# Patient Record
Sex: Female | Born: 1980 | Race: White | Hispanic: No | Marital: Single | Smoking: Never smoker
Health system: Southern US, Community
[De-identification: ages and names within clinical notes are randomized; demographics above are authoritative.]

## PROBLEM LIST (undated history)

## (undated) DIAGNOSIS — L309 Dermatitis, unspecified: Secondary | ICD-10-CM

## (undated) DIAGNOSIS — K529 Noninfective gastroenteritis and colitis, unspecified: Secondary | ICD-10-CM

## (undated) HISTORY — DX: Noninfective gastroenteritis and colitis, unspecified: K52.9

## (undated) HISTORY — DX: Dermatitis, unspecified: L30.9

## (undated) HISTORY — PX: HIP SURGERY: SHX245

---

## 2017-07-01 ENCOUNTER — Ambulatory Visit (INDEPENDENT_AMBULATORY_CARE_PROVIDER_SITE_OTHER): Payer: BC Managed Care – PPO

## 2017-07-01 ENCOUNTER — Ambulatory Visit (INDEPENDENT_AMBULATORY_CARE_PROVIDER_SITE_OTHER): Payer: BC Managed Care – PPO | Admitting: Orthopaedic Surgery

## 2017-07-01 ENCOUNTER — Encounter (INDEPENDENT_AMBULATORY_CARE_PROVIDER_SITE_OTHER): Payer: Self-pay | Admitting: Orthopaedic Surgery

## 2017-07-01 DIAGNOSIS — M25551 Pain in right hip: Secondary | ICD-10-CM

## 2017-07-01 MED ORDER — NABUMETONE 500 MG PO TABS: 500.0000 mg | ORAL_TABLET | Freq: Two times a day (BID) | ORAL | 1 refills | Status: DC | PRN

## 2017-07-01 MED ORDER — METHYLPREDNISOLONE 4 MG PO TABS: ORAL_TABLET | ORAL | 0 refills | Status: DC

## 2017-07-01 NOTE — Progress Notes (Signed)
Office Visit Note   Patient: Natasha Matthews           Date of Birth: Oct 24, 1980           MRN: [id number] Visit Date: 07/01/2017              Requested by: Farris Has, MD [address] [place], [place] [address] PCP: Farris Has, MD   Assessment & Plan: Visit Diagnoses:  1. Pain in right hip     Plan: I feel mostly that she is strained her gluteus medius and minimus tendon areas potentially some of her hamstring as well.  I will put her on a 6-day steroid taper and Relafen as an anti-inflammatory and send her physical therapy as well.  All questions concerns were answered and addressed.  If she continues to have significant problems after activity modification as well as the anti-inflammatories and therapy we would obtain an MRI of her right hip but hopefully we do not need to do that.  Follow-Up Instructions: Return in about 1 month (around 07/29/2017).   Orders:  Orders Placed This Encounter  Procedures  . XR HIP UNILAT W OR W/O PELVIS 1V RIGHT   Meds ordered this encounter  Medications  . methylPREDNISolone (MEDROL) 4 MG tablet    Sig: Medrol dose pack. Take as instructed    Dispense:  21 tablet    Refill:  0  . nabumetone (RELAFEN) 500 MG tablet    Sig: Take 1 tablet (500 mg total) by mouth 2 (two) times daily as needed.    Dispense:  60 tablet    Refill:  1      Procedures: No procedures performed   Clinical Data: No additional findings.   Subjective: Chief Complaint  Patient presents with  . Right Hip - Pain  The patient is a very athletic 37 year old female who comes in with a 5-week history of right hip pain.  She is playing volleyball in the splint and felt a pop and snap in her trochanteric area and hamstring area approximately.  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  She says that the driving is the worst right now.  Laying on that side is also painful to her.  She is  tried to back off on yoga and some of her other activities but still having a persistent pain.  She takes ibuprofen around-the-clock.  She is never injured this area before.  She denies any pain in the groin.  She denies any knee pain.  This is on the right side of the trochanteric area mostly where she points to.  HPI  Review of Systems She denies any recent illnesses or systemic issues.  Objective: Vital Signs: There were no vitals taken for this visit.  Physical Exam She is alert and oriented x3 and in no acute distress Ortho Exam Examination of her right hip shows diffuse tenderness around the proximal hamstring area and the trochanteric area and some around the IT band.  Range of motion is entirely full with extremes of internal or external rotation he feels pain over the trochanteric area. Specialty Comments:  No specialty comments available.  Imaging: Xr Hip Unilat W Or W/o Pelvis 1v Right  Result Date: 07/01/2017 An AP pelvis and lateral the right hip shows no acute findings.  The hip joint is well-maintained and the trochanteric area appears normal.    PMFS History: There  are no active problems to display for this patient.  History reviewed. No pertinent past medical history.  History reviewed. No pertinent family history.  History reviewed. No pertinent surgical history. Social History   Occupational History  . Not on file  Tobacco Use  . Smoking status: Not on file  Substance and Sexual Activity  . Alcohol use: Not on file  . Drug use: Not on file  . Sexual activity: Not on file

## 2017-07-24 ENCOUNTER — Encounter (INDEPENDENT_AMBULATORY_CARE_PROVIDER_SITE_OTHER): Payer: Self-pay | Admitting: Orthopaedic Surgery

## 2017-07-24 ENCOUNTER — Ambulatory Visit (INDEPENDENT_AMBULATORY_CARE_PROVIDER_SITE_OTHER): Payer: BC Managed Care – PPO | Admitting: Orthopaedic Surgery

## 2017-07-24 DIAGNOSIS — M25551 Pain in right hip: Secondary | ICD-10-CM | POA: Diagnosis not present

## 2017-07-24 NOTE — Progress Notes (Signed)
The patient is well-known to Natasha Matthews.  She is 37 year old very active and athletic female who injured her right hip during athletic activities on April 7 of this year.  It is now 8 weeks since this original injury.  She felt and heard pops around her right hip at the time.  She points the trochanteric area and issue was a source of her pain.  She is actually been through activity modification and rest.  She is been on anti-inflammatories.  I have been tried a steroid taper.  She has had formal physical therapy as well with even dry needling.  She has modified all of her activities and rested significantly.  She is still experiencing the significant amount of pain all around her right hip in spite of all the aforementioned activities.  On exam she still has significant pain to palpation all around the gluteus medius and minimus insertion at the greater trochanteric area on the right side.  She hurts significantly on the issue him as well.  There is no pain on the abductor aspect of the hip and no pain in the groin.  Her x-rays have been negative thus far.  At this point given the failure of all forms of conservative treatment measures, and MRI is warranted of the right hip to rule out a tear of the gluteus medius and minimus tendons and to assess the hamstring origin as well given her clinical signs and symptoms.  We will see her back once the MRI is obtained.

## 2017-07-25 ENCOUNTER — Other Ambulatory Visit (INDEPENDENT_AMBULATORY_CARE_PROVIDER_SITE_OTHER): Payer: Self-pay

## 2017-07-25 DIAGNOSIS — M25551 Pain in right hip: Secondary | ICD-10-CM

## 2017-07-28 ENCOUNTER — Telehealth (INDEPENDENT_AMBULATORY_CARE_PROVIDER_SITE_OTHER): Payer: Self-pay | Admitting: *Deleted

## 2017-07-28 NOTE — Telephone Encounter (Signed)
Pt called left message on vm stating she was in to see Dr. Magnus IvanBlackman on Thursday and he was going to order MRI and has not heard from anyone to schedule the MRI, I called pt back left vm stating I received the order on 07/25/17 and had to go thru her insurance to get MRI approved which has been approved and then it was sent to [facility] to schedule. I advised pt this will take a couple days for them to get her scheduled but if she preferred she can call imaging to schedule appt to expedite it some. Pending pt call back to schedule appt with imaging.

## 2017-08-02 ENCOUNTER — Ambulatory Visit
Admission: RE | Admit: 2017-08-02 | Discharge: 2017-08-02 | Disposition: A | Discharge status: Home / Self Care | Payer: BC Managed Care – PPO | Source: Ambulatory Visit | Attending: Orthopaedic Surgery | Admitting: Orthopaedic Surgery

## 2017-08-02 DIAGNOSIS — M25551 Pain in right hip: Secondary | ICD-10-CM

## 2017-08-04 ENCOUNTER — Ambulatory Visit (INDEPENDENT_AMBULATORY_CARE_PROVIDER_SITE_OTHER): Payer: BC Managed Care – PPO | Admitting: Physician Assistant

## 2017-08-04 ENCOUNTER — Encounter (INDEPENDENT_AMBULATORY_CARE_PROVIDER_SITE_OTHER): Payer: Self-pay | Admitting: Physician Assistant

## 2017-08-04 DIAGNOSIS — M25551 Pain in right hip: Secondary | ICD-10-CM

## 2017-08-04 MED ORDER — LIDOCAINE HCL 1 % IJ SOLN
3.0000 mL | INTRAMUSCULAR | Status: AC | PRN
  Administered 2017-08-04: 3 mL

## 2017-08-04 MED ORDER — METHYLPREDNISOLONE ACETATE 40 MG/ML IJ SUSP
40.0000 mg | INTRAMUSCULAR | Status: AC | PRN
  Administered 2017-08-04: 40 mg via INTRA_ARTICULAR

## 2017-08-04 NOTE — Progress Notes (Signed)
Office Visit Note   Patient: Natasha Matthews           Date of Birth: 06-01-1980           MRN: [id number] Visit Date: 08/04/2017              Requested by: Farris HasMorrow, Aaron, MD [address] [place], [place] [id number] PCP: Farris HasMorrow, Aaron, MD   Assessment & Plan: Visit Diagnoses:  1. Pain in right hip     Plan: We will see how she denies in regards to the right lateral hip injection.  She is trending towards improvement over the next 2 weeks then will obtain an MRI of her right hip with contrast to rule out labral tear.  Questions were encouraged and answered at length.  Follow-Up Instructions: Return in about 2 weeks (around 08/18/2017).   Orders:  Orders Placed This Encounter  Procedures  . Large Joint Inj   No orders of the defined types were placed in this encounter.     Procedures: Large Joint Inj: R greater trochanter on 08/04/2017 12:10 PM Indications: pain Details: 22 G 1.5 in needle, lateral approach  Arthrogram: No  Medications: 3 mL lidocaine 1 %; 40 mg methylPREDNISolone acetate 40 MG/ML Outcome: tolerated well, no immediate complications Procedure, treatment alternatives, risks and benefits explained, specific risks discussed. Consent was given by the patient. Immediately prior to procedure a time out was called to verify the correct patient, procedure, equipment, support staff and site/side marked as required. Patient was prepped and draped in the usual sterile fashion.       Clinical Data: No additional findings.   Subjective: Chief Complaint  Patient presents with  . Right Hip - Pain, Follow-up    HPI  Natasha Matthews returns today for follow-up of her right hip pain.  She underwent MRI of her right hip due to severe pain she is having the lateral aspect of the hip and also some pain in her issue region whenever she is sitting.  She states the pain lateral aspect of the hip is worse.  It is worse whenever she is lying on her left side.  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  She had no radicular symptoms down the leg.  Pain began in April when she did a split felt popping in the hip.  She has been taking naproxen which is upsetting her stomach some and is really not helping much.  She is tried a Medrol Dosepak without any real relief.  She is also tried formal physical therapy including dry needling this gave her no significant relief and in fact the stretching exercises irritated her lateral posterior hip pain. MRI without contrast dated 08/02/2017 no acute fractures avulsion.  Edematous changes are seen consistent with mild right hamstring and abductor strain at the right initial tuberosity.  Images were reviewed with the patient today. Review of Systems   Objective: Vital Signs: There were no vitals taken for this visit.  Physical Exam  Ortho Exam Left hip full range of motion without pain.  She has slightly limited flexion of the right hip compared to left.  Right hip internal and external rotation causes pain posterior lateral aspect of the hip.  She has maximal tenderness just posterior to the greater trochanteric region of the right hip.  There is no rashes skin lesions ulcerations in this region.  Logroll of the hip causes pain especially with internal  rotation. Specialty Comments:  No specialty comments available.  Imaging: No results found.   PMFS History: There are no active problems to display for this patient.  History reviewed. No pertinent past medical history.  History reviewed. No pertinent family history.  History reviewed. No pertinent surgical history. Social History   Occupational History  . Not on file  Tobacco Use  . Smoking status: Never Smoker  . Smokeless tobacco: Never Used  Substance and Sexual Activity  . Alcohol use: Not on file  . Drug use: Not on file  . Sexual activity: Not on file

## 2018-01-02 ENCOUNTER — Other Ambulatory Visit: Payer: Self-pay | Admitting: Family Medicine

## 2018-01-02 ENCOUNTER — Other Ambulatory Visit (HOSPITAL_COMMUNITY)
Admission: RE | Admit: 2018-01-02 | Discharge: 2018-01-02 | Disposition: A | Discharge status: Home / Self Care | Payer: BC Managed Care – PPO | Source: Ambulatory Visit | Attending: Family Medicine | Admitting: Family Medicine

## 2018-01-02 DIAGNOSIS — Z01411 Encounter for gynecological examination (general) (routine) with abnormal findings: Secondary | ICD-10-CM | POA: Insufficient documentation

## 2018-01-06 LAB — CYTOLOGY - PAP
DIAGNOSIS: NEGATIVE
HPV 16/18/45 genotyping: NEGATIVE
HPV: DETECTED — AB

## 2018-01-21 ENCOUNTER — Other Ambulatory Visit: Payer: Self-pay | Admitting: Family Medicine

## 2018-01-21 DIAGNOSIS — R1084 Generalized abdominal pain: Secondary | ICD-10-CM

## 2018-01-28 ENCOUNTER — Ambulatory Visit
Admission: RE | Admit: 2018-01-28 | Discharge: 2018-01-28 | Disposition: A | Discharge status: Home / Self Care | Payer: BC Managed Care – PPO | Source: Ambulatory Visit | Attending: Family Medicine | Admitting: Family Medicine

## 2018-01-28 DIAGNOSIS — R1084 Generalized abdominal pain: Secondary | ICD-10-CM

## 2018-09-18 DIAGNOSIS — M25551 Pain in right hip: Secondary | ICD-10-CM | POA: Insufficient documentation

## 2019-01-01 DIAGNOSIS — R0982 Postnasal drip: Secondary | ICD-10-CM | POA: Insufficient documentation

## 2019-01-28 ENCOUNTER — Other Ambulatory Visit: Payer: Self-pay | Admitting: Family Medicine

## 2019-01-28 DIAGNOSIS — R160 Hepatomegaly, not elsewhere classified: Secondary | ICD-10-CM

## 2019-02-10 ENCOUNTER — Ambulatory Visit
Admission: RE | Admit: 2019-02-10 | Discharge: 2019-02-10 | Disposition: A | Discharge status: Home / Self Care | Payer: BC Managed Care – PPO | Source: Ambulatory Visit | Attending: Family Medicine | Admitting: Family Medicine

## 2019-02-10 DIAGNOSIS — R160 Hepatomegaly, not elsewhere classified: Secondary | ICD-10-CM

## 2019-05-04 ENCOUNTER — Encounter: Payer: Self-pay | Admitting: Family Medicine

## 2019-05-04 ENCOUNTER — Ambulatory Visit: Payer: Self-pay

## 2019-05-04 ENCOUNTER — Ambulatory Visit: Payer: BC Managed Care – PPO | Admitting: Family Medicine

## 2019-05-04 ENCOUNTER — Other Ambulatory Visit: Payer: Self-pay

## 2019-05-04 DIAGNOSIS — G8929 Other chronic pain: Secondary | ICD-10-CM

## 2019-05-04 DIAGNOSIS — M25551 Pain in right hip: Secondary | ICD-10-CM

## 2019-05-04 MED ORDER — TRAMADOL HCL 50 MG PO TABS: 50.0000 mg | ORAL_TABLET | Freq: Four times a day (QID) | ORAL | 0 refills | Status: DC | PRN

## 2019-05-04 NOTE — Progress Notes (Signed)
Office Visit Note   Patient: Natasha Matthews           Date of Birth: [birth year]           MRN: [id number] Visit Date: 05/04/2019 Requested by: London Pepper, MD [address] [place],  [place] [address] PCP: London Pepper, MD  Subjective: Chief Complaint  Patient presents with  . Right Hip - Pain    Injured 05/2017---history of 2 surgeries, and it is not any better.  Her PT recommended Dr. Junius Roads, most recent surgery/imaging was 11/2018.  She has brought a lot of info with her today.    HPI: She is here at the request of Linton Rump for right hip pain.  She started having pain a couple years ago, mostly on the lateral aspect but some anterior pain as well.  She had a MRI arthrogram which was unrevealing.  She had a greater trochanter injection which did not help.  She then had another MRI arthrogram which appeared normal but her symptoms were such that she proceeded with arthroscopic intervention.  At the time of surgery she was diagnosed with synovitis, a labrum tear, a pincer deformity and a cam deformity.  These were all treated.  Unfortunately she did not have relief after surgery.  She was given an intra-articular cortisone injection which did not make much difference for her.  She continued to have symptoms so she had another arthroscopic surgery and was told she had another labrum tear and more synovitis.  She went to rheumatologist who drew labs which were negative for rheumatologic disease.  She is now working with Dr. Vilinda Blanks for physical therapy and is getting improvement with dry needling, but she continues to have pain on the lateral and anterior aspect.  She is scheduled for a third surgical consult in a couple months but wondered whether prolotherapy might benefit her.  Eyes any popping or clicking in the hip.               ROS:   All other systems were reviewed and are negative.  Objective: Vital Signs: There were no vitals taken for this visit.  Physical Exam:   General:  Alert and oriented, in no acute distress. Pulm:  Breathing unlabored. Psy:  Normal mood, congruent affect. Skin: No rash or erythema Right hip: She has.  Range of motion, hypermobile in both hips.  No audible or palpable popping or clicking with active range of motion.  There is tenderness on the lateral hip near the TFL muscle and the superior aspect of the greater trochanter.  She also has pain with passive flexion and internal rotation.  Imaging: Limited diagnostic ultrasound right hip: There is postoperative change at the femoral head consistent with shaving of the cam deformity.  Slight thickening of the joint capsule but no effusion, no hyperemia on power Doppler imaging.  No definite labral pathology.  Lateral hip musculature is slightly hypoechoic with hyperemia on power Doppler imaging in the region of recent dry needling.  No muscle tears, no tendon tears.   Assessment & Plan: 1.  Chronic right hip pain status post 2 separate arthroscopic procedures, etiology uncertain. -Elected to treat with dextrose prolotherapy injecting intra-articular as well as into the lateral hip musculature.  Follow-up in about 3 weeks for the second of possibly 3 injections.     Procedures: Right hip injection: After sterile prep with Betadine, injected 6 cc 1% lidocaine without epinephrine and 4 cc 50% dextrose into the hip  joint intra-articularly using ultrasound to guide needle placement.  Injectate was seen filling the joint capsule.  Then injected cc 1% lidocaine without epinephrine and 4 cc 50% dextrose into the T FL and lateral musculature by palpation.  She had modest improvement in pain during the anesthetic phase.    PMFS History: There are no problems to display for this patient.  History reviewed. No pertinent past medical history.  History reviewed. No pertinent family history.  History reviewed. No pertinent surgical history. Social History   Occupational History  . Not on  file  Tobacco Use  . Smoking status: Never Smoker  . Smokeless tobacco: Never Used  Substance and Sexual Activity  . Alcohol use: Not on file  . Drug use: Not on file  . Sexual activity: Not on file

## 2019-05-25 ENCOUNTER — Other Ambulatory Visit: Payer: Self-pay

## 2019-05-25 ENCOUNTER — Ambulatory Visit: Payer: Self-pay

## 2019-05-25 ENCOUNTER — Ambulatory Visit: Payer: BC Managed Care – PPO | Admitting: Family Medicine

## 2019-05-25 ENCOUNTER — Encounter: Payer: Self-pay | Admitting: Family Medicine

## 2019-05-25 DIAGNOSIS — M25551 Pain in right hip: Secondary | ICD-10-CM | POA: Diagnosis not present

## 2019-05-25 DIAGNOSIS — G8929 Other chronic pain: Secondary | ICD-10-CM

## 2019-05-25 NOTE — Progress Notes (Signed)
   Office Visit Note   Patient: Natasha Matthews           Date of Birth: Jun 30, 1980           MRN: [id number] Visit Date: 05/25/2019 Requested by: Farris Has, MD [address] [place],  [place] [address] PCP: Farris Has, MD  Subjective: Hip pain follow up  HPI: She is here for follow up of hip pain. She has had hip pain for two years after attempting a split. She has had two hip surgeries which repaired a labrum tear, a pincer deformity, and a CAM deformity. She has been seeing Beryle Flock for PT and has been getting some improvement with dry needling. She was seen on 3/16 and had prolotherapy intra-articular as well as lateral hip musculature. She reports that pain is similar (rates it 6/10) but she feels like her walking gait is a little smoother and she can cross her legs for a few minutes at at time (something she could not do before).                 ROS:   All other systems were reviewed and are negative.  Objective: Vital Signs: There were no vitals taken for this visit.  Physical Exam:  General:  Alert and oriented, in no acute distress. Pulm:  Breathing unlabored. Psy:  Normal mood, congruent affect. Skin: No rash or erythema Right hip: She has full range of motion, hypermobile in both hips.  No audible or palpable popping or clicking with active range of motion.  There is tenderness on the lateral hip near the TFL muscle and the superior aspect of the greater trochanter.  She also has pain with passive flexion and internal rotation.  Imaging: None today   Assessment & Plan: 1.  Chronic right hip pain status post 2 separate arthroscopic procedures, etiology uncertain. -2nd dextrose prolotherapy treatment today; injecting dextrose intra-articular as well as into the lateral hip musculature.  Follow-up in about 3 weeks for the third injection    Procedures: Right hip injection: After sterile prep with Betadine, injected 6 cc 1% lidocaine without  epinephrine and 4 cc 50% dextrose into the hip joint intra-articularly using ultrasound to guide needle placement.  Injectate was seen filling the joint capsule.  Then injected cc 1% lidocaine without epinephrine and 4 cc 50% dextrose into the T FL and lateral musculature by palpation.  She had good improvement in pain (from 6/10 to 1/10) during the anesthetic phase.    PMFS History: There are no problems to display for this patient.  History reviewed. No pertinent past medical history.  History reviewed. No pertinent family history.  History reviewed. No pertinent surgical history. Social History   Occupational History  . Not on file  Tobacco Use  . Smoking status: Never Smoker  . Smokeless tobacco: Never Used  Substance and Sexual Activity  . Alcohol use: Not on file  . Drug use: Not on file  . Sexual activity: Not on file

## 2019-05-25 NOTE — Progress Notes (Signed)
I saw and examined the patient with Dr. Robby Sermon and agree with assessment and plan as outlined.    Will proceed with dextrose prolo round 2 for chronic right hip pain.  Level of pain remains 6/10, but she feels that her ROM is better.    Given 10 cc of 20% dextrose/lidocaine intra-articular and also lateral hip/TFL today.  Return in 3 weeks for round 3.  Consider PRP if fails to improve.  Pain level improved to 1/10 during anesthetic phase.

## 2019-06-15 ENCOUNTER — Ambulatory Visit: Payer: BC Managed Care – PPO | Admitting: Family Medicine

## 2019-06-15 ENCOUNTER — Ambulatory Visit: Payer: Self-pay

## 2019-06-15 ENCOUNTER — Other Ambulatory Visit: Payer: Self-pay

## 2019-06-15 ENCOUNTER — Encounter: Payer: Self-pay | Admitting: Family Medicine

## 2019-06-15 DIAGNOSIS — M25551 Pain in right hip: Secondary | ICD-10-CM

## 2019-06-15 DIAGNOSIS — G8929 Other chronic pain: Secondary | ICD-10-CM | POA: Diagnosis not present

## 2019-06-15 NOTE — Progress Notes (Signed)
   Office Visit Note   Patient: Natasha Matthews           Date of Birth: 1980/04/09           MRN: [id number] Visit Date: 06/15/2019 Requested by: Farris Has, MD [address] [place],  [place] [address] PCP: Farris Has, MD  Subjective: Chief Complaint  Patient presents with  . Right Hip - Pain    Walking is a little more fluid. Is in the middle of moving right now (up & down stairs), so is aggravating the hip with that. Would like to proceed with prolotherapy injection #2.    HPI: She is here for planned right hip injection.  Overall she feels like she is improved.  She recently moved and is feeling some increased soreness from that, but even despite that she feels like the injections have been helpful.  This will be her third.  She is scheduled to see another hip specialist at [facility] next week.              ROS:   All other systems were reviewed and are negative.  Objective: Vital Signs: There were no vitals taken for this visit.  Physical Exam:  General:  Alert and oriented, in no acute distress. Pulm:  Breathing unlabored. Psy:  Normal mood, congruent affect.  Right hip: She remains tender to palpation near the TFL at the greater trochanter region.  Still has some pain when internally rotating her hip.  Imaging: US Guided Needle Placement - No Linked Charges  Result Date: 06/15/2019 Ultrasound-guided right hip injection: After sterile prep with Betadine, injected 6 cc 1% lidocaine without epinephrine and 4 cc 50% dextrose using a 22-gauge spinal needle, passing the needle through the iliofemoral ligament into the femoral head/neck junction.  Injected the same quantity of solutions into the TFL region as well.    Assessment & Plan: 1.  Chronic right hip pain -At this point she will follow-up as needed.  We can repeat injections in the future if needed.  Could contemplate PRP as well.     Procedures: No procedures performed  No notes on file      PMFS History: Patient Active Problem List   Diagnosis Date Noted  . Postnasal drip 01/01/2019  . Right hip pain 09/18/2018   No past medical history on file.  No family history on file.  No past surgical history on file. Social History   Occupational History  . Not on file  Tobacco Use  . Smoking status: Never Smoker  . Smokeless tobacco: Never Used  Substance and Sexual Activity  . Alcohol use: Not on file  . Drug use: Not on file  . Sexual activity: Not on file

## 2019-07-27 ENCOUNTER — Ambulatory Visit: Payer: BC Managed Care – PPO | Admitting: Family Medicine

## 2019-07-27 ENCOUNTER — Other Ambulatory Visit: Payer: Self-pay

## 2019-07-27 ENCOUNTER — Encounter: Payer: Self-pay | Admitting: Family Medicine

## 2019-07-27 ENCOUNTER — Ambulatory Visit: Payer: Self-pay

## 2019-07-27 DIAGNOSIS — M25551 Pain in right hip: Secondary | ICD-10-CM

## 2019-07-27 DIAGNOSIS — G8929 Other chronic pain: Secondary | ICD-10-CM

## 2019-07-27 NOTE — Progress Notes (Signed)
   Office Visit Note   Patient: Natasha Matthews           Date of Birth: [birth year]           MRN: [id number] Visit Date: 07/27/2019 Requested by: Farris Has, MD [address] [place],  [place] [address] PCP: Farris Has, MD  Subjective: Chief Complaint  Patient presents with  . Right Hip - Pain, Follow-up    Has had recent tests at [facility] (MRI and MR Arthrogram). She did also see the hip specialist there. ? Injection today    HPI: She is here for follow-up chronic right hip pain.  She went for another surgical opinion, had an MRI arthrogram and the interpretation was that she has excessive adhesions in her hip.  Another surgery was recommended, she is very hesitant to proceed.  She wonders whether ongoing injections might continue to give her improvement.              ROS:   All other systems were reviewed and are negative.  Objective: Vital Signs: There were no vitals taken for this visit.  Physical Exam:  General:  Alert and oriented, in no acute distress. Pulm:  Breathing unlabored. Psy:  Normal mood, congruent affect.  Right hip: She has internal rotation limited by about 10 degrees compared to the left, and external rotation limited by about 25 degrees.  She has pain at the extremes.  Imaging: US Guided Needle Placement  Result Date: 07/27/2019 Ultrasound-guided right hip injection: After sterile prep with Betadine, injected 6 cc 1% lidocaine without epinephrine and 4 cc 50% dextrose using a 22-gauge spinal needle, passing the needle through the iliofemoral ligament into the femoral head/neck junction.  Injected 50% of it into the joint, and 50% into the anterior capsule.    Assessment & Plan: 1.  Chronic right hip pain status post multiple arthroscopic surgeries, with adhesions per recent MRI. -Discussed with her and elected to proceed with another dextrose prolotherapy injections, part of it intra-articular and part into the anterior aspect of the joint  capsule.  We can repeat this in 3 to 4 weeks if it seems to be helping.  If it seems to get worse, we can do a one-time injection of cortisone into the capsule.     Procedures: No procedures performed  No notes on file     PMFS History: Patient Active Problem List   Diagnosis Date Noted  . Postnasal drip 01/01/2019  . Right hip pain 09/18/2018   No past medical history on file.  No family history on file.  No past surgical history on file. Social History   Occupational History  . Not on file  Tobacco Use  . Smoking status: Never Smoker  . Smokeless tobacco: Never Used  Substance and Sexual Activity  . Alcohol use: Not on file  . Drug use: Not on file  . Sexual activity: Not on file

## 2019-08-02 ENCOUNTER — Encounter: Payer: Self-pay | Admitting: Family Medicine

## 2019-08-10 ENCOUNTER — Encounter: Payer: Self-pay | Admitting: Family Medicine

## 2019-08-16 ENCOUNTER — Ambulatory Visit: Payer: BC Managed Care – PPO | Admitting: Family Medicine

## 2019-08-25 ENCOUNTER — Encounter: Payer: Self-pay | Admitting: Family Medicine

## 2019-08-25 ENCOUNTER — Other Ambulatory Visit: Payer: Self-pay

## 2019-08-25 ENCOUNTER — Ambulatory Visit: Payer: Self-pay

## 2019-08-25 ENCOUNTER — Ambulatory Visit: Payer: BC Managed Care – PPO | Admitting: Family Medicine

## 2019-08-25 DIAGNOSIS — M25551 Pain in right hip: Secondary | ICD-10-CM | POA: Diagnosis not present

## 2019-08-25 DIAGNOSIS — G8929 Other chronic pain: Secondary | ICD-10-CM

## 2019-08-25 MED ORDER — HYDROCODONE-ACETAMINOPHEN 5-325 MG PO TABS: 1.0000 | ORAL_TABLET | Freq: Four times a day (QID) | ORAL | 0 refills | Status: DC | PRN

## 2019-08-25 NOTE — Progress Notes (Signed)
   Office Visit Note   Patient: Natasha Matthews           Date of Birth: 12/19/1980           MRN: [id number] Visit Date: 08/25/2019 Requested by: London Pepper, MD [address],  [place] [address] PCP: London Pepper, MD  Subjective: Chief Complaint  Patient presents with  . Right Hip - Pain, Follow-up    Requesting another prolotherapy injection    HPI: She is here for another right hip dextrose prolotherapy injection into the anterior joint capsule.  First injection for adhesions caused quite a bit of pain lasting for a few weeks.  She is feeling better now, but recently met with another surgeon who thought that possible osteotomy might benefit her.  She is very hesitant to consider any more surgery.              ROS:   All other systems were reviewed and are negative.  Objective: Vital Signs: There were no vitals taken for this visit.  Physical Exam:  General:  Alert and oriented, in no acute distress. Pulm:  Breathing unlabored. Psy:  Normal mood, congruent affect.  Good ROM today.  Imaging: US Guided Needle Placement - No Linked Charges  Result Date: 08/25/2019 Procedure: Ultrasound-guided right hip injection: After sterile prep with Betadine, injected 6 cc 1% lidocaine without epinephrine and 4 cc 50% dextrose using a 22-gauge spinal needle, passing the needle into the anterior joint capsule.    Assessment & Plan: 1.   Chronic right hip pain status post arthroscopic labral debridement -Dextrose prolotherapy injected as above.  Follow-up in 3 to 4 weeks for repeat injection if needed.  Hydrocodone given for pain.     Procedures: No procedures performed  No notes on file     PMFS History: Patient Active Problem List   Diagnosis Date Noted  . Postnasal drip 01/01/2019  . Right hip pain 09/18/2018   History reviewed. No pertinent past medical history.  History reviewed. No pertinent family history.  History reviewed. No pertinent  surgical history. Social History   Occupational History  . Not on file  Tobacco Use  . Smoking status: Never Smoker  . Smokeless tobacco: Never Used  Substance and Sexual Activity  . Alcohol use: Not on file  . Drug use: Not on file  . Sexual activity: Not on file

## 2019-09-02 ENCOUNTER — Encounter: Payer: Self-pay | Admitting: Family Medicine

## 2019-09-08 ENCOUNTER — Ambulatory Visit: Payer: Self-pay

## 2019-09-08 ENCOUNTER — Ambulatory Visit: Payer: BC Managed Care – PPO | Admitting: Family Medicine

## 2019-09-08 ENCOUNTER — Other Ambulatory Visit: Payer: Self-pay

## 2019-09-08 ENCOUNTER — Encounter: Payer: Self-pay | Admitting: Family Medicine

## 2019-09-08 DIAGNOSIS — M25551 Pain in right hip: Secondary | ICD-10-CM | POA: Diagnosis not present

## 2019-09-08 DIAGNOSIS — G8929 Other chronic pain: Secondary | ICD-10-CM

## 2019-09-08 NOTE — Progress Notes (Signed)
   Office Visit Note   Patient: Natasha Matthews           Date of Birth: 1980-06-19           MRN: [id number] Visit Date: 09/08/2019 Requested by: Farris Has, MD [address] [place],  [place] [address] PCP: Farris Has, MD  Subjective: No chief complaint on file.   HPI: She is here with persistent chronic right hip pain.  The last prolotherapy injection into the anterior capsule seemed to be much better tolerated than the first 1, and she thinks she might be somewhat better.  She states that her hip feels more like it did last summer.  She is contemplating surgical options including the newest option of reconstruction of her labrum using tensor fascia lata muscle.              ROS:   All other systems were reviewed and are negative.  Objective: Vital Signs: There were no vitals taken for this visit.  Physical Exam:  General:  Alert and oriented, in no acute distress. Pulm:  Breathing unlabored. Psy:  Normal mood, congruent affect.  Right hip: She has flexion almost equivalent to the left, with pain at the extreme.  She has internal rotation of about 45 degrees before pain limits further motion.  On the left she has internal rotation of almost 90 degrees.  Imaging: US Guided Needle Placement  Result Date: 09/08/2019 Ultrasound-guided right hip injection: After sterile prep with Betadine, injected 6 cc 1% lidocaine without epinephrine and 4 cc 50% dextrose using a 22-gauge spinal needle, passing the needle into the anterior joint capsule.  I also pulled out 2 cc of the fluid intra-articularly.    Assessment & Plan: 1.  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  As long as she continues to notice improvement, we can repeat these as needed.     Procedures: No procedures performed  No notes on file      PMFS History: Patient Active Problem List   Diagnosis Date Noted  . Postnasal drip 01/01/2019  . Right hip pain 09/18/2018   History reviewed. No pertinent past medical history.  History reviewed. No pertinent family history.  History reviewed. No pertinent surgical history. Social History   Occupational History  . Not on file  Tobacco Use  . Smoking status: Never Smoker  . Smokeless tobacco: Never Used  Substance and Sexual Activity  . Alcohol use: Not on file  . Drug use: Not on file  . Sexual activity: Not on file

## 2019-10-01 ENCOUNTER — Ambulatory Visit: Payer: Self-pay

## 2019-10-01 ENCOUNTER — Encounter: Payer: Self-pay | Admitting: Family Medicine

## 2019-10-01 ENCOUNTER — Ambulatory Visit: Payer: BC Managed Care – PPO | Admitting: Family Medicine

## 2019-10-01 ENCOUNTER — Other Ambulatory Visit: Payer: Self-pay

## 2019-10-01 DIAGNOSIS — M25551 Pain in right hip: Secondary | ICD-10-CM

## 2019-10-01 DIAGNOSIS — G8929 Other chronic pain: Secondary | ICD-10-CM | POA: Diagnosis not present

## 2019-10-01 NOTE — Progress Notes (Signed)
   Office Visit Note   Patient: Natasha Matthews           Date of Birth: 06/18/1980           MRN: [id number] Visit Date: 10/01/2019 Requested by: Farris Has, MD [address] [place],  [place] [address] PCP: Farris Has, MD  Subjective: Chief Complaint  Patient presents with  . Right Hip - Pain    HPI: She is here for follow-up chronic right hip pain.  She feels like she has made slight progress, she is able to cross her legs for a little while but not for very long.  She tried jogging briefly but could not do it.  She tried yoga but that bothered her as well.  She feels a grinding sensation in her hip sometimes as well as intermittent popping.              ROS:   All other systems were reviewed and are negative.  Objective: Vital Signs: There were no vitals taken for this visit.  Physical Exam:  General:  Alert and oriented, in no acute distress. Pulm:  Breathing unlabored. Psy:  Normal mood, congruent affect.  Right hip: She has almost full range of motion compared to the left.  Very slight limitation with extension and with internal rotation.  She has pain with passive flexion and internal rotation.  Imaging: US Guided Needle Placement  Result Date: 10/01/2019 Limited diagnostic ultrasound right hip: The anterior labrum appears blunted postsurgically.  Toward the medial side of her labrum there is a deeper calcification, which again is probably postsurgical.  The anterior capsule looks slightly thickened but overall I think it looks better than previous scans.  No joint effusion today.  No hyperemia on power Doppler imaging. Procedure: Ultrasound-guided right hip injection: After sterile prep with Betadine, injected 6 cc 1% lidocaine without epinephrine and 4 cc 50% dextrose using a 22-gauge spinal needle, passing the needle into the anterior joint capsule.    Assessment & Plan: 1.  Very slightly improved chronic right hip pain status post multiple  arthroscopies -Anterior capsule injected again using dextrose.  We will order MRI arthrogram to see if there are any changes.  If no indication for surgery, then we might try PRP.        PMFS History: Patient Active Problem List   Diagnosis Date Noted  . Postnasal drip 01/01/2019  . Right hip pain 09/18/2018   History reviewed. No pertinent past medical history.  History reviewed. No pertinent family history.  History reviewed. No pertinent surgical history. Social History   Occupational History  . Not on file  Tobacco Use  . Smoking status: Never Smoker  . Smokeless tobacco: Never Used  Substance and Sexual Activity  . Alcohol use: Not on file  . Drug use: Not on file  . Sexual activity: Not on file

## 2019-10-19 ENCOUNTER — Ambulatory Visit
Admission: RE | Admit: 2019-10-19 | Discharge: 2019-10-19 | Disposition: A | Discharge status: Home / Self Care | Payer: BC Managed Care – PPO | Source: Ambulatory Visit | Attending: Family Medicine | Admitting: Family Medicine

## 2019-10-19 ENCOUNTER — Other Ambulatory Visit: Payer: Self-pay

## 2019-10-19 DIAGNOSIS — M25551 Pain in right hip: Secondary | ICD-10-CM

## 2019-10-19 MED ORDER — IOPAMIDOL (ISOVUE-M 200) INJECTION 41%
15.0000 mL | Freq: Once | INTRAMUSCULAR | Status: AC
  Administered 2019-10-19: 15 mL via INTRA_ARTICULAR

## 2019-10-20 ENCOUNTER — Telehealth: Payer: Self-pay | Admitting: Family Medicine

## 2019-10-20 NOTE — Telephone Encounter (Signed)
Hip MRI arthrogram shows post-surgical labrum changes, but no new tear.

## 2019-12-27 ENCOUNTER — Encounter: Payer: Self-pay | Admitting: Family Medicine

## 2021-03-21 DIAGNOSIS — S060XAA Concussion with loss of consciousness status unknown, initial encounter: Secondary | ICD-10-CM

## 2021-03-21 HISTORY — DX: Concussion with loss of consciousness status unknown, initial encounter: S06.0XAA

## 2021-08-03 ENCOUNTER — Other Ambulatory Visit: Payer: Self-pay | Admitting: Specialist

## 2021-08-03 DIAGNOSIS — R519 Headache, unspecified: Secondary | ICD-10-CM

## 2021-08-22 ENCOUNTER — Other Ambulatory Visit: Payer: Self-pay | Admitting: Specialist

## 2021-08-22 DIAGNOSIS — F0781 Postconcussional syndrome: Secondary | ICD-10-CM

## 2021-08-31 ENCOUNTER — Ambulatory Visit
Admission: RE | Admit: 2021-08-31 | Discharge: 2021-08-31 | Disposition: A | Discharge status: Home / Self Care | Payer: Self-pay | Source: Ambulatory Visit | Attending: Specialist | Admitting: Specialist

## 2021-08-31 DIAGNOSIS — F0781 Postconcussional syndrome: Secondary | ICD-10-CM

## 2021-10-07 ENCOUNTER — Emergency Department (HOSPITAL_BASED_OUTPATIENT_CLINIC_OR_DEPARTMENT_OTHER)
Admission: EM | Admit: 2021-10-07 | Discharge: 2021-10-07 | Disposition: A | Discharge status: Home / Self Care | Payer: BC Managed Care – PPO | Attending: Emergency Medicine | Admitting: Emergency Medicine

## 2021-10-07 ENCOUNTER — Encounter (HOSPITAL_BASED_OUTPATIENT_CLINIC_OR_DEPARTMENT_OTHER): Payer: Self-pay | Admitting: Emergency Medicine

## 2021-10-07 ENCOUNTER — Other Ambulatory Visit: Payer: Self-pay

## 2021-10-07 DIAGNOSIS — R519 Headache, unspecified: Secondary | ICD-10-CM

## 2021-10-07 DIAGNOSIS — M542 Cervicalgia: Secondary | ICD-10-CM | POA: Insufficient documentation

## 2021-10-07 LAB — CBC WITH DIFFERENTIAL/PLATELET
Abs Immature Granulocytes: 0.01 10*3/uL (ref 0.00–0.07)
Basophils Absolute: 0 10*3/uL (ref 0.0–0.1)
Basophils Relative: 1 %
Eosinophils Absolute: 0.2 10*3/uL (ref 0.0–0.5)
Eosinophils Relative: 3 %
HCT: 38.5 % (ref 36.0–46.0)
Hemoglobin: 13 g/dL (ref 12.0–15.0)
Immature Granulocytes: 0 %
Lymphocytes Relative: 16 %
Lymphs Abs: 0.9 10*3/uL (ref 0.7–4.0)
MCH: 30.6 pg (ref 26.0–34.0)
MCHC: 33.8 g/dL (ref 30.0–36.0)
MCV: 90.6 fL (ref 80.0–100.0)
Monocytes Absolute: 0.5 10*3/uL (ref 0.1–1.0)
Monocytes Relative: 8 %
Neutro Abs: 4.2 10*3/uL (ref 1.7–7.7)
Neutrophils Relative %: 72 %
Platelets: 299 10*3/uL (ref 150–400)
RBC: 4.25 MIL/uL (ref 3.87–5.11)
RDW: 12.4 % (ref 11.5–15.5)
WBC: 5.8 10*3/uL (ref 4.0–10.5)
nRBC: 0 % (ref 0.0–0.2)

## 2021-10-07 LAB — BASIC METABOLIC PANEL
Anion gap: 8 (ref 5–15)
BUN: 12 mg/dL (ref 6–20)
CO2: 26 mmol/L (ref 22–32)
Calcium: 9.4 mg/dL (ref 8.9–10.3)
Chloride: 101 mmol/L (ref 98–111)
Creatinine, Ser: 0.96 mg/dL (ref 0.44–1.00)
GFR, Estimated: >60 mL/min (ref >60)
Glucose, Bld: 84 mg/dL (ref 70–99)
Potassium: 3.8 mmol/L (ref 3.5–5.1)
Sodium: 135 mmol/L (ref 135–145)

## 2021-10-07 LAB — HCG, QUANTITATIVE, PREGNANCY: hCG, Beta Chain, Quant, S: <1 m[IU]/mL (ref <5)

## 2021-10-07 LAB — MAGNESIUM: Magnesium: 2.1 mg/dL (ref 1.7–2.4)

## 2021-10-07 MED ORDER — DIPHENHYDRAMINE HCL 50 MG/ML IJ SOLN
12.5000 mg | Freq: Once | INTRAMUSCULAR | Status: AC
  Administered 2021-10-07: 12.5 mg via INTRAVENOUS
  Filled 2021-10-07: qty 1

## 2021-10-07 MED ORDER — DEXAMETHASONE SODIUM PHOSPHATE 10 MG/ML IJ SOLN
10.0000 mg | Freq: Once | INTRAMUSCULAR | Status: AC
  Administered 2021-10-07: 10 mg via INTRAVENOUS
  Filled 2021-10-07: qty 1

## 2021-10-07 MED ORDER — PROCHLORPERAZINE EDISYLATE 10 MG/2ML IJ SOLN
10.0000 mg | Freq: Once | INTRAMUSCULAR | Status: AC
  Administered 2021-10-07: 10 mg via INTRAVENOUS
  Filled 2021-10-07: qty 2

## 2021-10-07 MED ORDER — LACTATED RINGERS IV BOLUS
1000.0000 mL | Freq: Once | INTRAVENOUS | Status: AC
  Administered 2021-10-07: 1000 mL via INTRAVENOUS

## 2021-10-07 MED ORDER — KETOROLAC TROMETHAMINE 30 MG/ML IJ SOLN
30.0000 mg | Freq: Once | INTRAMUSCULAR | Status: AC
  Administered 2021-10-07: 30 mg via INTRAVENOUS
  Filled 2021-10-07: qty 1

## 2021-10-07 NOTE — ED Notes (Addendum)
Is having slight bilateral tingling/numbness.

## 2021-10-07 NOTE — ED Notes (Signed)
Discharge paperwork given and verbally understood. 

## 2021-10-07 NOTE — ED Provider Notes (Signed)
[facility] EMERGENCY DEPT Provider Note   CSN: [id number] Arrival date & time: 10/07/21  1153     History  Chief Complaint  Patient presents with   Neck Pain    Natasha Matthews is a 41 y.o. female.  With PMH of concussion and postconcussive syndrome who is being followed by neurology and recently had an LP to rule out MS on Tuesday followed by a post LP headache and a blood patch this past Friday 3 days prior to presentation presenting with continued headache and bilateral neck pain.  Patient said that she does have a ongoing history of headache and visual changes since her history of concussion.  This led to her getting the MRI.  However she had some abnormal changes on her MRI that was concerning for possible MS which led to her getting an LP with her neurology team.  She was told that her LP was all unremarkable but she has not heard the results of the MS test.  However after getting the LP this past Tuesday she had a severe headache that worsened with movement changes mainly standing up or sitting up but improved with laying down.  She got a blood patch and had improvement of that headache and no longer has pain with change of position but still has a pressure-like headache in the back of her head that was not severe in onset and has been constant.  She notes associated tingling in her hands.  She also notes generalized fatigue and weakness with lightheadedness but no focal weakness, paresthesias, full loss of sensation, acute visual change, dysarthria, dysphagia, aphasia.  She has had no fevers, vomiting, diarrhea or other infectious symptoms.  No back pain at LP site.  She tried reaching out to her neurologist but has not heard back.  She has not tqaken anything for headache today.   Neck Pain      Home Medications Prior to Admission medications   Medication Sig Start Date End Date Taking? Authorizing Provider  amphetamine-dextroamphetamine (ADDERALL XR) 10 MG 24 hr  capsule Take 10 mg by mouth daily.   Yes [provider]  zolpidem (AMBIEN) 5 MG tablet Take 5 mg by mouth at bedtime as needed for sleep.   Yes [provider]  EPINEPHrine 0.3 mg/0.3 mL IJ SOAJ injection USE AS DIRECTED AS NEEDED LIFE THREATENING ALLERGIC REACTION. 03/04/19   [provider]  HYDROcodone-acetaminophen (NORCO/VICODIN) 5-325 MG tablet Take 1 tablet by mouth every 6 (six) hours as needed for moderate pain. 08/25/19   Hilts, Casimiro Needle, MD  levonorgestrel-ethinyl estradiol (LYBREL) 90-20 MCG tablet     [provider]  loratadine (CLARITIN) 10 MG tablet Take by mouth.    [provider]  methocarbamol (ROBAXIN) 750 MG tablet  07/12/18   [provider]  traMADol (ULTRAM) 50 MG tablet Take 1 tablet (50 mg total) by mouth every 6 (six) hours as needed. 05/04/19   Hilts, Casimiro Needle, MD  tretinoin (RETIN-A) 0.025 % cream Apply 1 application topically at bedtime. 01/29/19   [provider]  triamcinolone (KENALOG) 0.025 % cream 1 Add'l Sig topical Select Frequency 12/31/18   [provider]      Allergies    Other, Nsaids, Amoxicillin, and Sulfa antibiotics    Review of Systems   Review of Systems  Musculoskeletal:  Positive for neck pain.    Physical Exam Updated Vital Signs BP 124/82   Pulse 75   Temp 98.4 F (36.9 C)   Resp 14  SpO2 100%  Physical Exam Constitutional: Alert and oriented. Very well appearing and in no distress. GCS 15 Eyes: Conjunctivae are normal. ENT      Head: Normocephalic and atraumatic.      Nose: No congestion.      Mouth/Throat: Mucous membranes are moist.      Neck: No stridor. No meningismus.  Cardiovascular: S1, S2,  Normal and symmetric distal pulses are present in all extremities.Warm and well perfused. Respiratory: Normal respiratory effort. Breath sounds are normal. Gastrointestinal: Soft and nontender.  Musculoskeletal: Normal range of motion in all extremities. LP site  lower lumbar region with no surrounding ecchymoses, no hematoma, no active drainage, no warmth and no tenderness to palpation. Neurologic: Normal speech and language without aphasia. AAOx4. PERRL. EOMI without nystagmus. Face symmetrical without droop. No visual cuts. CN II-XII intact. Normal facial sensation. Tongue midline. 5/5 strength in upper and lower extremities. Rapid alternating hand movements normal. Normal sensation to light touch in all extremities. Normal finger-to-nose. Normal gait. No focal neurologic deficits are appreciated. Skin: Skin is warm, dry and intact. No rash noted. Psychiatric: Mood and affect are normal. Speech and behavior are normal.  ED Results / Procedures / Treatments   Labs (all labs ordered are listed, but only abnormal results are displayed) Labs Reviewed  BASIC METABOLIC PANEL  CBC WITH DIFFERENTIAL/PLATELET  HCG, QUANTITATIVE, PREGNANCY  MAGNESIUM    EKG None  Radiology No results found.  Procedures Procedures    Medications Ordered in ED Medications  lactated ringers bolus 1,000 mL (0 mLs Intravenous Stopped 10/07/21 1427)  prochlorperazine (COMPAZINE) injection 10 mg (10 mg Intravenous Given 10/07/21 1331)  diphenhydrAMINE (BENADRYL) injection 12.5 mg (12.5 mg Intravenous Given 10/07/21 1326)  ketorolac (TORADOL) 30 MG/ML injection 30 mg (30 mg Intravenous Given 10/07/21 1329)  dexamethasone (DECADRON) injection 10 mg (10 mg Intravenous Given 10/07/21 1328)    ED Course/ Medical Decision Making/ A&P Clinical Course as of 10/07/21 1456  Sun Oct 07, 2021  1455 Patient's workup all generally unremarkable and reassuring.  Normal white blood cell count 5.8 consistent with no concern for infection at this time.  Especially with extremely well appearance and nontoxic appearing.  No anemia hemoglobin 13.  No evidence of bleeding from LP site.  No acute electrolyte abnormalities.  Patient feeling better with medication and will follow-up with neurology  in the outpatient setting, strict return precaution discussed.  She is in agreement with this plan. [VB]    Clinical Course User Index [VB] Mardene Sayer, MD                           Medical Decision Making Danilyn Cocke is a 41 y.o. female.  With PMH of concussion and postconcussion syndrome who is being followed by neurology and recently had an LP to rule out MS on Tuesday followed by a post LP headache and a blood patch this past Friday 3 days prior to presentation presenting with continued headache and bilateral neck pain.   Regarding the patient's headache, I suspect it is most consistent with primary headache in nature such as tension or migraine headache.  It is no longer positional which makes me think it is unlikely to be related to post LP headache.  I do not think CVA as the patient has no focal neurologic deficits. Additionally, I do not think SAH as the patient did not have severe onset of pain at beginning of headache, they do not have  focal neurologic deficits, and are overall non-toxic in appearance and additionally had an MRI July 14 as listed below with no evidence of aneurysm. I do not suspect meningitis with no illness symptoms, no rash, nontoxic appearance, and no meningismus on exam.  Her LP site is well-healing with no active drainage, no bruising, no abnormalities.  Will obtain basic blood work to ensure no acute electrolyte abnormalities or anemia contributing to generalized fatigue.  We will give migraine cocktail with IV fluids, IV Toradol, IV Compazine, IV Benadryl and IV Decadron.  Plan for her to follow-up with her neurologist outpatient regarding symptoms.   MRI 08/31/21 per results "IMPRESSION: 1. No evidence of acute intracranial abnormality. 2. Several small foci of T2 FLAIR hyperintense signal abnormality scattered within the bilateral subcortical white matter, an unexpected finding in a patient of this age. These signal changes are nonspecific and  differential considerations include age advanced chronic small vessel ischemic disease, sequela of chronic migraine headaches, sequela of prior trauma, sequela of a prior infectious/inflammatory process or less likely (given the distribution) sequela of a demyelinating process (such as multiple sclerosis), among others."  Amount and/or Complexity of Data Reviewed Labs: ordered.  Risk Prescription drug management.    Final Clinical Impression(s) / ED Diagnoses Final diagnoses:  Nonintractable headache, unspecified chronicity pattern, unspecified headache type    Rx / DC Orders ED Discharge Orders     None         Elgie Congo, MD 10/07/21 1456

## 2021-10-07 NOTE — Discharge Instructions (Addendum)
You have been seen in the Emergency Department (ED) for a headache.  Your exam and labs are reassuring.  Please use Tylenol and Motrin as needed for symptoms, but only as written on the box. You also need to drink PLENTY of water.  As we have discussed, please follow up with your neurologist as soon as possible, ideally within one week, regarding today's ED visit and your headache symptoms.    Call your doctor or return to the ED if you have a worsening headache, sudden and severe headache, confusion, slurred speech, facial droop, fainting spells, weakness or numbness in any arm or leg, extreme fatigue, or other symptoms that concern you.

## 2021-10-07 NOTE — ED Triage Notes (Signed)
Pt had lumbar puncture on Tuesday to check for MS and c/o severe headache on tuesday and neck pain since friday.  Clotting patch applied on Friday to control leakage from puncture site.  Patch improved headache.  Pt is able to move neck, chin to chest in triage without difficulty. C/o dizziness as well since Friday.   Pt has not taken any pain medications today.

## 2021-12-24 ENCOUNTER — Encounter: Payer: Self-pay | Admitting: *Deleted

## 2021-12-24 ENCOUNTER — Other Ambulatory Visit: Payer: Self-pay | Admitting: Specialist

## 2021-12-24 DIAGNOSIS — M5412 Radiculopathy, cervical region: Secondary | ICD-10-CM

## 2021-12-24 DIAGNOSIS — R9082 White matter disease, unspecified: Secondary | ICD-10-CM

## 2021-12-24 DIAGNOSIS — S0990XA Unspecified injury of head, initial encounter: Secondary | ICD-10-CM

## 2021-12-26 ENCOUNTER — Ambulatory Visit (INDEPENDENT_AMBULATORY_CARE_PROVIDER_SITE_OTHER): Payer: BC Managed Care – PPO | Admitting: Diagnostic Neuroimaging

## 2021-12-26 ENCOUNTER — Encounter: Payer: Self-pay | Admitting: Diagnostic Neuroimaging

## 2021-12-26 VITALS — BP 121/77 | HR 76 | Ht 69.0 in | Wt 129.2 lb

## 2021-12-26 DIAGNOSIS — R519 Headache, unspecified: Secondary | ICD-10-CM

## 2021-12-26 DIAGNOSIS — F0781 Postconcussional syndrome: Secondary | ICD-10-CM | POA: Diagnosis not present

## 2021-12-26 NOTE — Patient Instructions (Signed)
g
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
## 2022-01-17 ENCOUNTER — Ambulatory Visit
Admission: RE | Admit: 2022-01-17 | Discharge: 2022-01-17 | Disposition: A | Discharge status: Home / Self Care | Payer: BC Managed Care – PPO | Source: Ambulatory Visit | Attending: Specialist | Admitting: Specialist

## 2022-01-17 DIAGNOSIS — R9082 White matter disease, unspecified: Secondary | ICD-10-CM

## 2022-01-17 DIAGNOSIS — M5412 Radiculopathy, cervical region: Secondary | ICD-10-CM

## 2022-01-17 DIAGNOSIS — S0990XA Unspecified injury of head, initial encounter: Secondary | ICD-10-CM
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
## 2022-01-18 ENCOUNTER — Ambulatory Visit: Payer: Self-pay

## 2022-01-18 ENCOUNTER — Ambulatory Visit (INDEPENDENT_AMBULATORY_CARE_PROVIDER_SITE_OTHER): Payer: BC Managed Care – PPO | Admitting: Family Medicine

## 2022-01-18 ENCOUNTER — Ambulatory Visit (INDEPENDENT_AMBULATORY_CARE_PROVIDER_SITE_OTHER): Payer: BC Managed Care – PPO

## 2022-01-18 ENCOUNTER — Encounter: Payer: Self-pay | Admitting: Family Medicine

## 2022-01-18 VITALS — BP 122/86 | HR 88 | Ht 69.0 in | Wt 130.0 lb

## 2022-01-18 DIAGNOSIS — M2550 Pain in unspecified joint: Secondary | ICD-10-CM | POA: Diagnosis not present

## 2022-01-18 DIAGNOSIS — M25852 Other specified joint disorders, left hip: Secondary | ICD-10-CM | POA: Diagnosis not present

## 2022-01-18 DIAGNOSIS — M25552 Pain in left hip: Secondary | ICD-10-CM

## 2022-01-18 DIAGNOSIS — M25551 Pain in right hip: Secondary | ICD-10-CM

## 2022-01-18 LAB — VITAMIN D 25 HYDROXY (VIT D DEFICIENCY, FRACTURES): VITD: 66.22 ng/mL (ref 30.00–100.00)

## 2022-01-18 LAB — CBC WITH DIFFERENTIAL/PLATELET
Basophils Absolute: 0.1 10*3/uL (ref 0.0–0.1)
Basophils Relative: 1 % (ref 0.0–3.0)
Eosinophils Absolute: 0.1 10*3/uL (ref 0.0–0.7)
Eosinophils Relative: 1.8 % (ref 0.0–5.0)
HCT: 39.4 % (ref 36.0–46.0)
Hemoglobin: 13.1 g/dL (ref 12.0–15.0)
Lymphocytes Relative: 19.1 % (ref 12.0–46.0)
Lymphs Abs: 1 10*3/uL (ref 0.7–4.0)
MCHC: 33.3 g/dL (ref 30.0–36.0)
MCV: 90 fl (ref 78.0–100.0)
Monocytes Absolute: 0.4 10*3/uL (ref 0.1–1.0)
Monocytes Relative: 8.8 % (ref 3.0–12.0)
Neutro Abs: 3.5 10*3/uL (ref 1.4–7.7)
Neutrophils Relative %: 69.3 % (ref 43.0–77.0)
Platelets: 380 10*3/uL (ref 150.0–400.0)
RBC: 4.38 Mil/uL (ref 3.87–5.11)
RDW: 13.4 % (ref 11.5–15.5)
WBC: 5 10*3/uL (ref 4.0–10.5)

## 2022-01-18 LAB — IBC PANEL
Iron: 83 ug/dL (ref 42–145)
Saturation Ratios: 23 % (ref 20.0–50.0)
TIBC: 361.2 ug/dL (ref 250.0–450.0)
Transferrin: 258 mg/dL (ref 212.0–360.0)

## 2022-01-18 LAB — COMPREHENSIVE METABOLIC PANEL
ALT: 14 U/L (ref 0–35)
AST: 23 U/L (ref 0–37)
Albumin: 4.3 g/dL (ref 3.5–5.2)
Alkaline Phosphatase: 36 U/L — ABNORMAL LOW (ref 39–117)
BUN: 12 mg/dL (ref 6–23)
CO2: 30 mEq/L (ref 19–32)
Calcium: 9.4 mg/dL (ref 8.4–10.5)
Chloride: 103 mEq/L (ref 96–112)
Creatinine, Ser: 1.01 mg/dL (ref 0.40–1.20)
GFR: 70.28 mL/min (ref >60.00)
Glucose, Bld: 103 mg/dL — ABNORMAL HIGH (ref 70–99)
Potassium: 4 mEq/L (ref 3.5–5.1)
Sodium: 139 mEq/L (ref 135–145)
Total Bilirubin: 0.4 mg/dL (ref 0.2–1.2)
Total Protein: 7.7 g/dL (ref 6.0–8.3)

## 2022-01-18 LAB — FERRITIN: Ferritin: 13.8 ng/mL (ref 10.0–291.0)

## 2022-01-18 LAB — SEDIMENTATION RATE: Sed Rate: 71 mm/hr — ABNORMAL HIGH (ref 0–20)

## 2022-01-18 LAB — VITAMIN B12: Vitamin B-12: 574 pg/mL (ref 211–911)

## 2022-01-18 LAB — TSH: TSH: 1.53 u[IU]/mL (ref 0.35–5.50)

## 2022-01-18 NOTE — Assessment & Plan Note (Signed)
Patient is having impingement but seems to be more calcific in nature.  Patient has had difficulty into her contralateral hip and has had 3 surgeries.  Concerned that she could be potentially having an early cam deformity.  Patient is a 41 year old female.  Tries to be very active.  Discussed with patient about laboratory workup.  Due to the significant daily activities, patient is unable to workout, and is concerned that she will start getting arthritis on the other side and waking her up at night and failed already the home exercises and physical therapy in outside facility I do feel that advanced imaging with an MR arthrogram is necessary.  We will get this ordered and see how patient responds depending on the findings we will change medical management.  Patient wants to hold on any prescription medications at this time

## 2022-01-18 NOTE — Patient Instructions (Addendum)
Xray today L hip MRA L hip Labs today Have appt in 5-6 weeks

## 2022-01-20 ENCOUNTER — Encounter: Payer: Self-pay | Admitting: Family Medicine

## 2022-01-20 LAB — PTH, INTACT AND CALCIUM
Calcium: 9.5 mg/dL (ref 8.6–10.2)
PTH: 12 pg/mL — ABNORMAL LOW (ref 16–77)

## 2022-01-20 LAB — CALCIUM, IONIZED: Calcium, Ion: 5 mg/dL (ref 4.7–5.5)

## 2022-01-21 ENCOUNTER — Ambulatory Visit: Payer: BC Managed Care – PPO | Admitting: Family Medicine

## 2022-02-05 ENCOUNTER — Ambulatory Visit
Admission: RE | Admit: 2022-02-05 | Discharge: 2022-02-05 | Disposition: A | Discharge status: Home / Self Care | Payer: BC Managed Care – PPO | Source: Ambulatory Visit | Attending: Family Medicine | Admitting: Family Medicine

## 2022-02-05 DIAGNOSIS — M25551 Pain in right hip: Secondary | ICD-10-CM

## 2022-02-05 MED ORDER — IOPAMIDOL (ISOVUE-M 200) INJECTION 41%
15.0000 mL | Freq: Once | INTRAMUSCULAR | Status: AC
  Administered 2022-02-05: 15 mL via INTRA_ARTICULAR

## 2022-02-19 ENCOUNTER — Encounter: Payer: Self-pay | Admitting: Family Medicine

## 2022-02-20 NOTE — Progress Notes (Signed)
Natasha Matthews Phone: [phone] Subjective:    I'm seeing this patient by the request  of:  Natasha Pepper, MD  CC: Left hip pain  GEX:[id number]  01/18/2022 Patient is having impingement but seems to be more calcific in nature. Patient has had difficulty into her contralateral hip and has had 3 surgeries. Concerned that she could be potentially having an early cam deformity. Patient is a 42 year old female. Tries to be very active. Discussed with patient about laboratory workup. Due to the significant daily activities, patient is unable to workout, and is concerned that she will start getting arthritis on the other side and waking her up at night and failed already the home exercises and physical therapy in outside facility I do feel that advanced imaging with an MR arthrogram is necessary. We will get this ordered and see how patient responds depending on the findings we will change medical management. Patient wants to hold on any prescription medications at this time   Update 02/27/2022 Natasha Matthews is a 42 y.o. female coming in with complaint of L hp pain. Patient states that she is feeling the same as last visit.   L hip MRI 02/05/2022 IMPRESSION: 1. Left anterior labral tear. Left superior labral detachment at the labrocartilaginous junction.    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[employer]  Tobacco Use   Smoking status: Never   Smokeless tobacco: Never  Vaping Use   Vaping Use: Never used  Substance and Sexual Activity    Alcohol use: Not Currently   Drug use: Never   Sexual activity: Not on file  Other Topics Concern   Not on file  Social History Narrative   Caffeien- occas   Social Determinants of Health   Financial Resource Strain: Not on file  Food Insecurity: Not on file  Transportation Needs: Not on file  Physical Activity: Not on file  Stress: Not on file  Social Connections: Not on file   Allergies  Allergen Reactions   Other Diarrhea    Gastroenterologists instructed patient not to take NSAIDS Gastroenterologists instructed patient not to take NSAIDS   Nsaids Other (See Comments)    Pt instructed to avoid due to colitis dx.   Amoxicillin Rash   Sulfa Antibiotics Rash   No family history on file.  Current Outpatient Medications (Endocrine & Metabolic):    levonorgestrel-ethinyl estradiol (LYBREL) 90-20 MCG tablet,    predniSONE (DELTASONE) 20 MG tablet, Take 1 tablet (20 mg total) by mouth daily with breakfast.  Current Outpatient Medications (Cardiovascular):    EPINEPHrine 0.3 mg/0.3 mL IJ SOAJ injection, USE AS DIRECTED AS NEEDED LIFE THREATENING ALLERGIC REACTION.  Current Outpatient Medications (Respiratory):    loratadine (CLARITIN) 10 MG tablet, Take by mouth.  Current Outpatient Medications (Analgesics):    NURTEC 75 MG TBDP, Take 1 tablet by mouth every other day.   Current Outpatient Medications (Other):    amphetamine-dextroamphetamine (ADDERALL XR) 10 MG 24 hr capsule, Take 10 mg by  mouth daily.   methocarbamol (ROBAXIN) 750 MG tablet,    tiZANidine (ZANAFLEX) 2 MG tablet, Take 1 tablet (2 mg total) by mouth at bedtime.   tretinoin (RETIN-A) 0.025 % cream, Apply 1 application topically at bedtime.   triamcinolone (KENALOG) 0.025 % cream, 1 Add'l Sig topical Select Frequency   zolpidem (AMBIEN) 5 MG tablet, Take 5 mg by mouth at bedtime as needed for sleep.   Reviewed prior external information including notes and imaging from  primary care provider As well as  notes that were available from care everywhere and other healthcare systems.  Past medical history, social, surgical and family history all reviewed in electronic medical record.  No pertanent information unless stated regarding to the chief complaint.   Review of Systems:  No headache, visual changes, nausea, vomiting, diarrhea, constipation, dizziness, abdominal pain, skin rash, fevers, chills, night sweats, weight loss, swollen lymph nodes, body aches, joint swelling, chest pain, shortness of breath, mood changes. POSITIVE muscle aches  Objective  Blood pressure 110/66, pulse 94, height 5\' 9"  (1.753 m), weight 129 lb (58.5 kg), SpO2 98 %.   General: No apparent distress alert and oriented x3 mood and affect normal, dressed appropriately.  HEENT: Pupils equal, extraocular movements intact  Respiratory: Patient's speak in full sentences and does not appear short of breath  Cardiovascular: No lower extremity edema, non tender, no erythema  Left hip exam still has some limited range of motion but still moderately better than the contralateral side.  The patient does have tenderness to palpation in the paraspinal musculature minorly left sacroiliac joint.    Impression and Recommendations:    The above documentation has been reviewed and is accurate and complete Lyndal Pulley, DO

## 2022-02-27 ENCOUNTER — Ambulatory Visit: Payer: Self-pay

## 2022-02-27 ENCOUNTER — Ambulatory Visit: Payer: BC Managed Care – PPO | Admitting: Family Medicine

## 2022-02-27 VITALS — BP 110/66 | HR 94 | Ht 69.0 in | Wt 129.0 lb

## 2022-02-27 DIAGNOSIS — M25852 Other specified joint disorders, left hip: Secondary | ICD-10-CM | POA: Diagnosis not present

## 2022-02-27 DIAGNOSIS — M25552 Pain in left hip: Secondary | ICD-10-CM | POA: Diagnosis not present

## 2022-02-27 MED ORDER — TIZANIDINE HCL 2 MG PO TABS: 2.0000 mg | ORAL_TABLET | Freq: Every day | ORAL | 0 refills | Status: DC

## 2022-02-27 MED ORDER — PREDNISONE 20 MG PO TABS: 20.0000 mg | ORAL_TABLET | Freq: Every day | ORAL | 0 refills | Status: DC

## 2022-02-27 MED ORDER — PREDNISONE 20 MG PO TABS: 20.0000 mg | ORAL_TABLET | Freq: Every day | ORAL | 0 refills | Status: AC

## 2022-02-27 NOTE — Assessment & Plan Note (Addendum)
Discussed with patient at great length.  Total time reviewing patient's imaging and discussing with patient 37 minutes.  We discussed with patient about which activities to do and which ones to avoid.  We discussed the possibility of PRP or surgical intervention.  Due to patient having significantly difficulty and postsurgical changes and arthritic changes now on the right hip patient would like to avoid any surgical intervention.  We discussed with patient that also we can consider the possibility of osteopathic manipulation but not highly likely will make a significant difference either patient would like to start increasing activity and see how she does and will get back to [place] if depending on any other changes.  Patient will follow-up with me again in 2 months to further evaluate patient was given prednisone as well and Zanaflex for breakthrough pain with patient traveling in the near future as well.

## 2022-02-27 NOTE — Patient Instructions (Addendum)
Good to see you  Zanaflex 2mg  at night Prednisone 20mg  for 5 days in a row if needed Read about PRP Follow up in 2 months just in case

## 2022-03-22 ENCOUNTER — Other Ambulatory Visit: Payer: Self-pay | Admitting: Family Medicine

## 2022-05-01 ENCOUNTER — Ambulatory Visit: Payer: BC Managed Care – PPO | Admitting: Family Medicine

## 2022-05-07 ENCOUNTER — Ambulatory Visit: Payer: BC Managed Care – PPO | Admitting: Diagnostic Neuroimaging

## 2022-07-16 IMAGING — MR MR HIP*R* W/CM
4 of 5 series · 22 of 40 positions shown · IV contrast (agent unspecified)
Comparison: 09/10/2017

CLINICAL DATA: Chronic right hip pain. History of labral tear with
prior labral repair, debridement, and synovectomy.

EXAM:
MRI ARTHROGRAM OF THE RIGHT HIP WITH INTRA-ARTICULAR CONTRAST
TECHNIQUE: Multiplanar, multisequence MR imaging was performed following the
administration of intra-articular contrast.
CONTRAST:  See injection report

[Series 3: T1 · coronal · 4.0mm · 0.55mm/px · 7 of 26 slices shown]
[im 1/26]
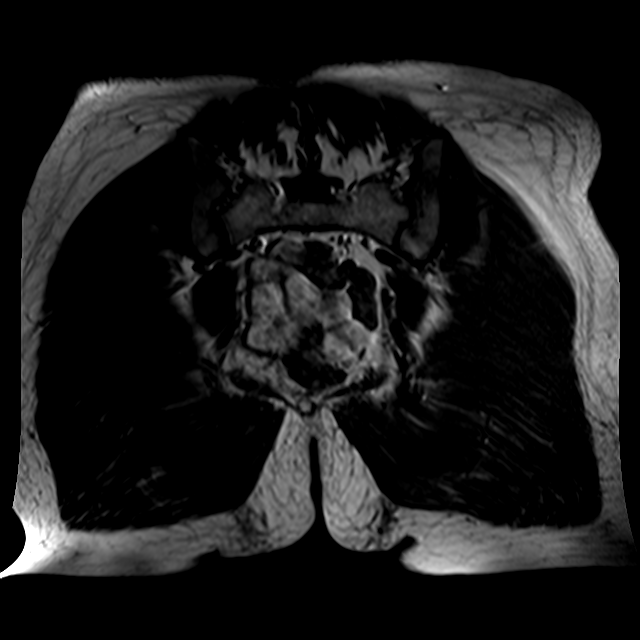
[im 5/26]
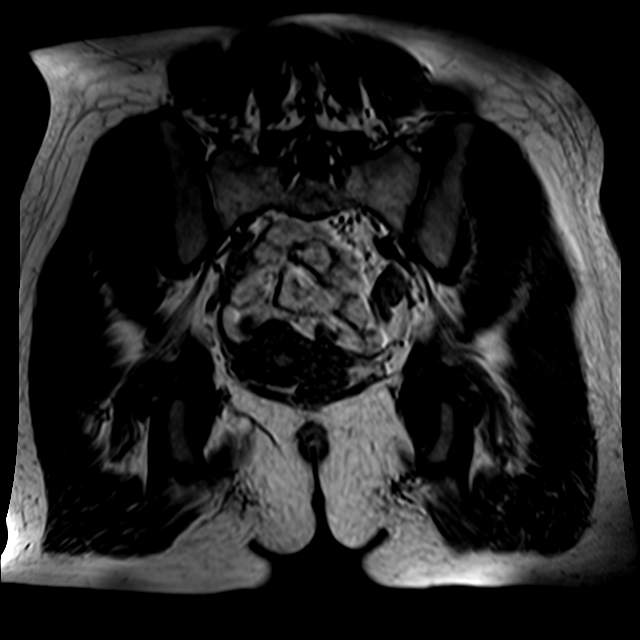
[im 9/26]
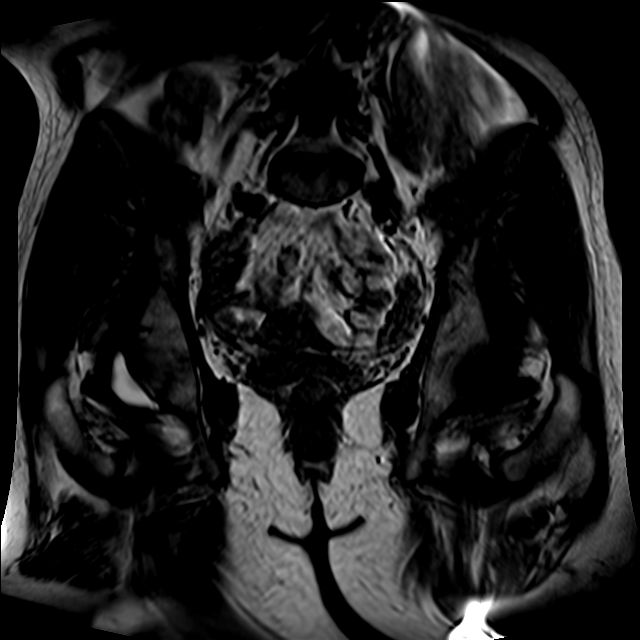
[im 13/26]
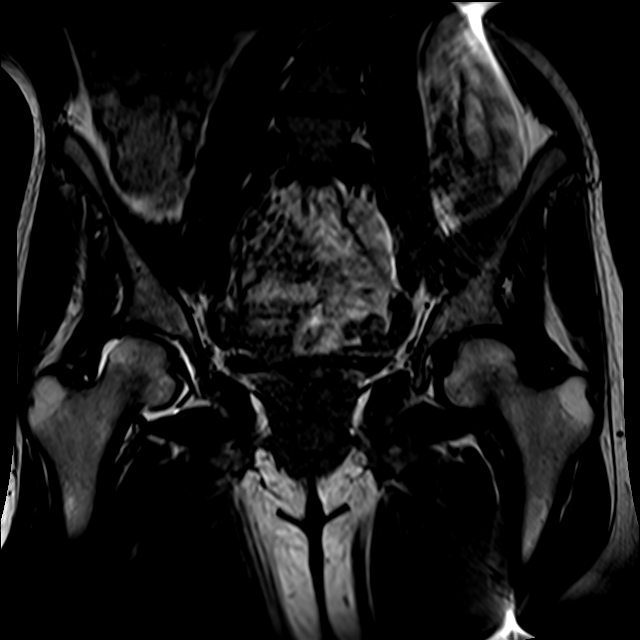
[im 17/26]
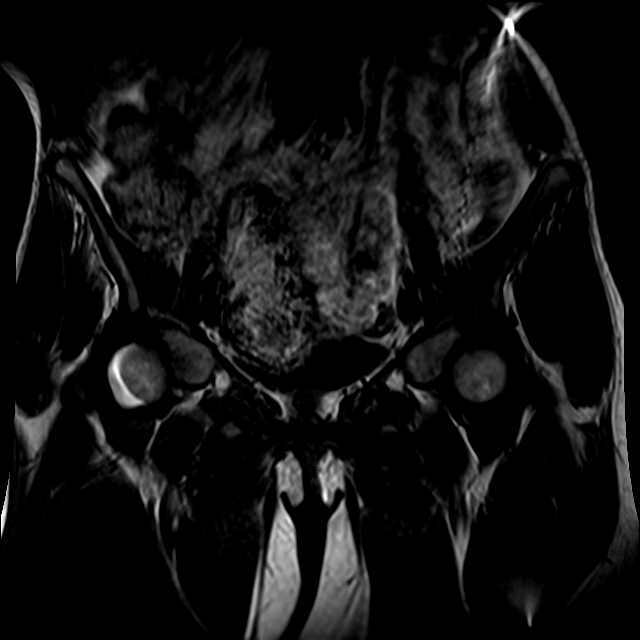
[im 21/26]
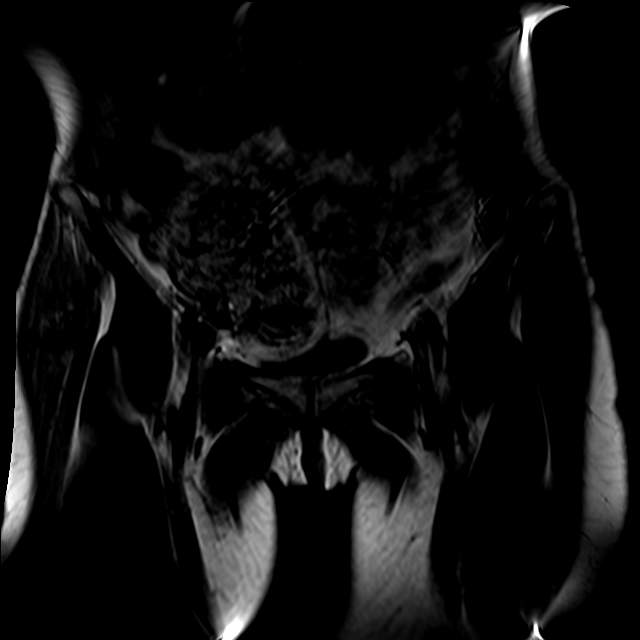
[im 26/26]
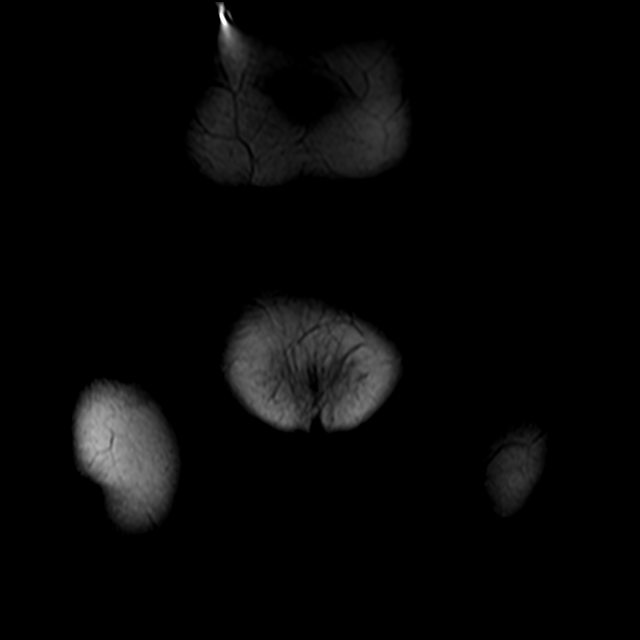

[Series 4: T2 fat-sat · coronal · 4.0mm · 0.68mm/px · 7 of 26 slices shown]
[im 1/26]
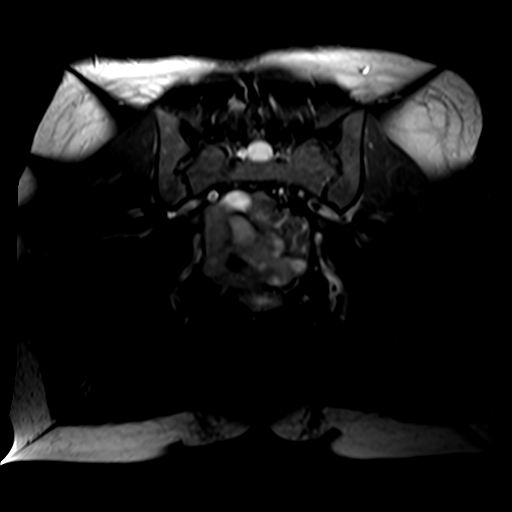
[im 5/26]
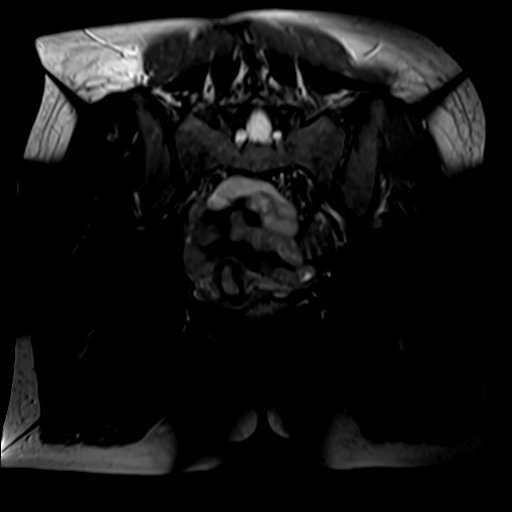
[im 9/26]
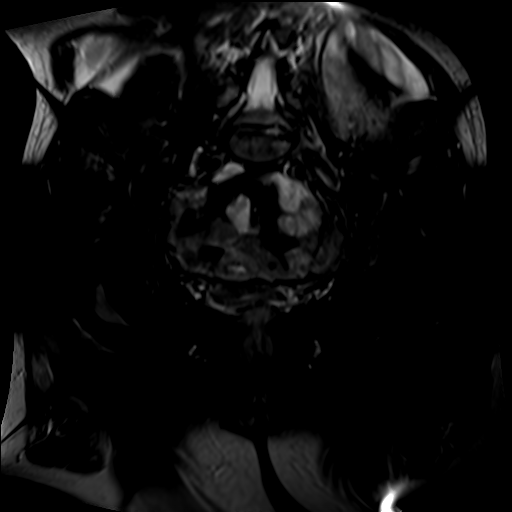
[im 13/26]
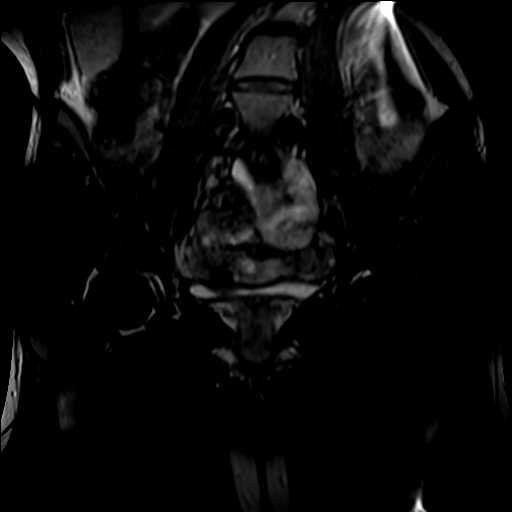
[im 17/26]
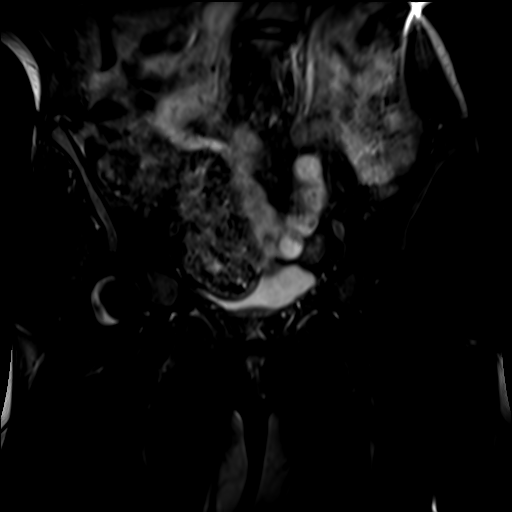
[im 21/26]
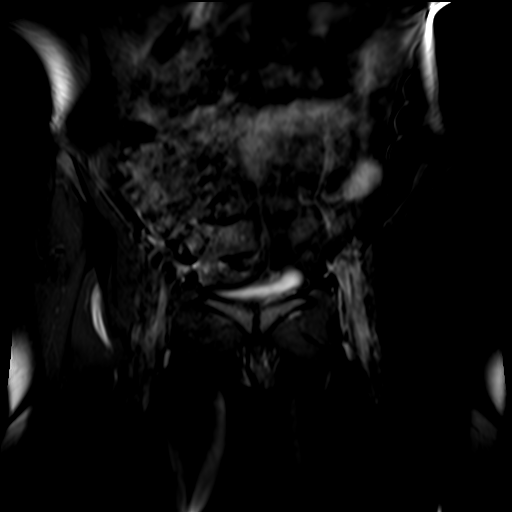
[im 26/26]
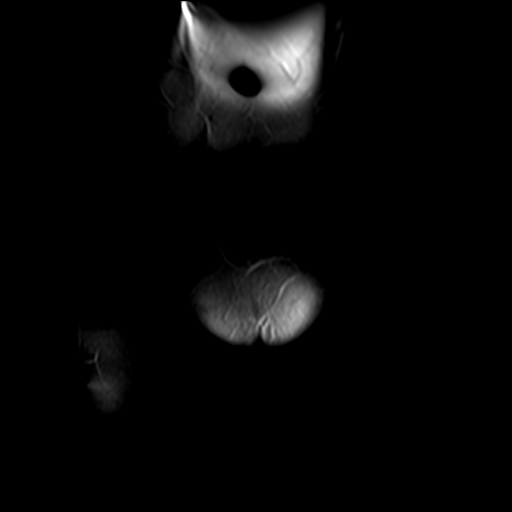

[Series 6: T1 fat-sat · axial · 4.0mm · 0.70mm/px · z∈[-153,-54]mm · 5 of 31 slices shown (1 of 2)]
[im 1/31]
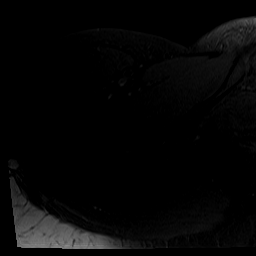
[im 4/31]
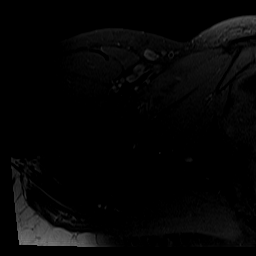
[im 8/31]
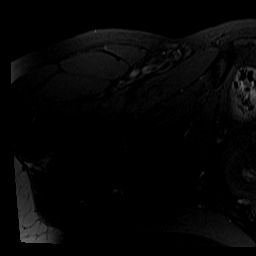
[im 16/31]
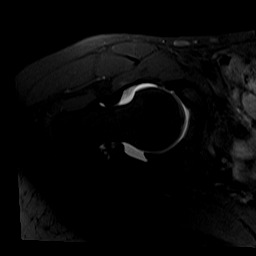
[im 27/31]
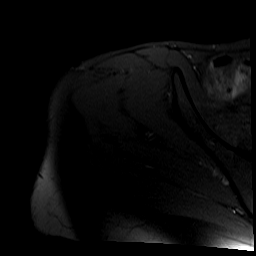

[Series 7: T1 fat-sat · sagittal · 4.0mm · 0.74mm/px · 3 of 35 slices shown (2 of 2)]
[im 4/35]
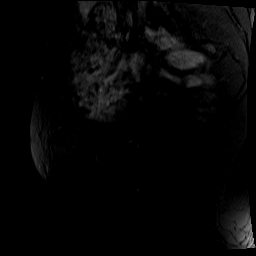
[im 19/35]
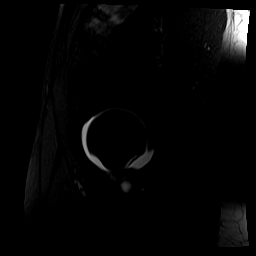
[im 31/35]
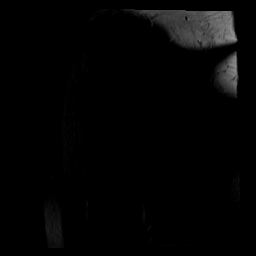

[22 of 40 positions shown; findings below may reference images not displayed]

FINDINGS: Bones: Unremarkable

Articular cartilage and labrum

Articular cartilage:  Unremarkable

Labrum: Prior labral repair. Posterior sublabral sulcus, unchanged.
No new or definite recurrent tear identified. Mild blunting of the
anterior superior labrum probably from prior repair.

Joint: No free fragment or obvious synovitis.

Bursa: No regional bursitis.

Muscles and tendons

Muscles and tendons: Unremarkable. The proximal hamstring
tendinopathy previously seen on the right side is no longer readily
apparent.

Other findings

Miscellaneous:   No supplemental non-categorized findings.
IMPRESSION: 1. Prior labral repair. No new or definite recurrent tear is
identified.
2. The proximal hamstring tendinopathy previously seen on the right
side is no longer readily apparent.

## 2023-09-10 ENCOUNTER — Other Ambulatory Visit: Payer: Self-pay | Admitting: Family Medicine

## 2023-09-10 DIAGNOSIS — Z1231 Encounter for screening mammogram for malignant neoplasm of breast: Secondary | ICD-10-CM

## 2023-10-08 ENCOUNTER — Ambulatory Visit
Admission: RE | Admit: 2023-10-08 | Discharge: 2023-10-08 | Disposition: A | Discharge status: Home / Self Care | Payer: Self-pay | Source: Ambulatory Visit | Attending: Family Medicine | Admitting: Family Medicine

## 2023-10-08 DIAGNOSIS — Z1231 Encounter for screening mammogram for malignant neoplasm of breast: Secondary | ICD-10-CM

## 2023-10-13 ENCOUNTER — Other Ambulatory Visit: Payer: Self-pay | Admitting: Family Medicine

## 2023-10-13 DIAGNOSIS — R928 Other abnormal and inconclusive findings on diagnostic imaging of breast: Secondary | ICD-10-CM

## 2023-10-15 ENCOUNTER — Ambulatory Visit
Admission: RE | Admit: 2023-10-15 | Discharge: 2023-10-15 | Disposition: A | Discharge status: Home / Self Care | Source: Ambulatory Visit | Attending: Family Medicine | Admitting: Family Medicine

## 2023-10-15 DIAGNOSIS — R928 Other abnormal and inconclusive findings on diagnostic imaging of breast: Secondary | ICD-10-CM

## 2023-10-16 ENCOUNTER — Other Ambulatory Visit: Payer: Self-pay | Admitting: Family Medicine

## 2023-10-16 DIAGNOSIS — R921 Mammographic calcification found on diagnostic imaging of breast: Secondary | ICD-10-CM

## 2024-05-17 ENCOUNTER — Encounter
